# Patient Record
Sex: Female | Born: 2000 | Race: White | Hispanic: No | Marital: Single | Smoking: Never smoker
Health system: Southern US, Community
[De-identification: ages and names within clinical notes are randomized; demographics above are authoritative.]

## PROBLEM LIST (undated history)

## (undated) DIAGNOSIS — H539 Unspecified visual disturbance: Secondary | ICD-10-CM

## (undated) DIAGNOSIS — F419 Anxiety disorder, unspecified: Secondary | ICD-10-CM

## (undated) DIAGNOSIS — H6690 Otitis media, unspecified, unspecified ear: Secondary | ICD-10-CM

## (undated) DIAGNOSIS — H70009 Acute mastoiditis without complications, unspecified ear: Secondary | ICD-10-CM

## (undated) DIAGNOSIS — Z68.41 Body mass index (BMI) pediatric, greater than or equal to 95th percentile for age: Secondary | ICD-10-CM

## (undated) HISTORY — PX: TYMPANOSTOMY TUBE PLACEMENT: SHX32

## (undated) HISTORY — PX: TONSILLECTOMY: SUR1361

## (undated) HISTORY — DX: Otitis media, unspecified, unspecified ear: H66.90

## (undated) HISTORY — DX: Unspecified visual disturbance: H53.9

## (undated) HISTORY — PX: MASTOIDECTOMY: SHX711

## (undated) HISTORY — DX: Acute mastoiditis without complications, unspecified ear: H70.009

## (undated) HISTORY — DX: Body mass index (bmi) pediatric, greater than or equal to 95th percentile for age: Z68.54

## (undated) HISTORY — PX: ADENOIDECTOMY: SUR15

## (undated) HISTORY — DX: Anxiety disorder, unspecified: F41.9

---
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
